# Patient Record
Sex: Female | Born: 1995 | Hispanic: Yes | Marital: Single | Smoking: Never smoker
Health system: Southern US, Community
[De-identification: ages and names within clinical notes are randomized; demographics above are authoritative.]

## PROBLEM LIST (undated history)

## (undated) HISTORY — PX: LAPAROSCOPIC GASTRIC SLEEVE RESECTION: SHX5895

---
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
## 2021-11-11 ENCOUNTER — Ambulatory Visit: Payer: Medicaid Other | Admitting: Internal Medicine

## 2021-11-11 NOTE — Progress Notes (Signed)
Pt did not show for scheduled appointment.  

## 2021-12-16 ENCOUNTER — Ambulatory Visit (INDEPENDENT_AMBULATORY_CARE_PROVIDER_SITE_OTHER): Payer: Medicaid Other | Admitting: Internal Medicine

## 2021-12-16 VITALS — BP 102/67 | HR 70 | Resp 14 | Ht 63.0 in | Wt 176.0 lb

## 2021-12-16 DIAGNOSIS — Z7189 Other specified counseling: Secondary | ICD-10-CM

## 2021-12-16 DIAGNOSIS — E669 Obesity, unspecified: Secondary | ICD-10-CM

## 2021-12-16 DIAGNOSIS — Z9989 Dependence on other enabling machines and devices: Secondary | ICD-10-CM | POA: Diagnosis not present

## 2021-12-16 DIAGNOSIS — G4733 Obstructive sleep apnea (adult) (pediatric): Secondary | ICD-10-CM | POA: Diagnosis not present

## 2021-12-16 NOTE — Progress Notes (Addendum)
[facility] [address] [place], [place] [address]  Pulmonary Sleep Medicine   Office Visit Note  Patient Name: Kelsey Wright DOB: [birth year] MRN [id number]    Chief Complaint: Obstructive Sleep Apnea visit  Brief History:  Kelsey Wright is seen today for initial consult to establish care and insurance compliance visit. The patient has a 7 month history of sleep apnea stating she had EDS, morning headaches, with mood changes and difficulty focusing. Patient compliantly using PAP nightly on small N30 nasal mask, until her machine got misplaced during her move..  The patient feels better after sleeping with PAP. Reported sleepiness is  resolved when using therapy and the Epworth Sleepiness Score is 5 out of 24. The patient does not take naps. The patient complains of the following: no problems  The compliance download shows  compliance with an average use time of 4:47 hours@ 93%. The AHI is 1.0  The patient does not complain of limb movements disrupting sleep, she did have this but it has resolved since starting cpap.   ROS  General: (-) fever, (-) chills, (-) night sweat Nose and Sinuses: (-) nasal stuffiness or itchiness, (-) postnasal drip, (-) nosebleeds, (-) sinus trouble. Mouth and Throat: (-) sore throat, (-) hoarseness. Neck: (-) swollen glands, (-) enlarged thyroid, (-) neck pain. Respiratory: - cough, - shortness of breath, - wheezing. Neurologic: - numbness, - tingling. Psychiatric: - anxiety, - depression   Current Medication: Outpatient Encounter Medications as of 12/16/2021  Medication Sig   omeprazole (PRILOSEC) 20 MG capsule Take 40 mg by mouth daily.   ursodiol (ACTIGALL) 300 MG capsule Take 300 mg by mouth 2 (two) times daily.   No facility-administered encounter medications on file as of 12/16/2021.    Surgical History: Past Surgical History:  Procedure Laterality Date   LAPAROSCOPIC GASTRIC SLEEVE RESECTION      Medical History: History  reviewed. No pertinent past medical history.  Family History: Non contributory to the present illness  Social History: Social History   Socioeconomic History   Marital status: Single    Spouse name: Not on file   Number of children: Not on file   Years of education: Not on file   Highest education level: Not on file  Occupational History   Not on file  Tobacco Use   Smoking status: Never   Smokeless tobacco: Never  Substance and Sexual Activity   Alcohol use: Not on file   Drug use: Not on file   Sexual activity: Not on file  Other Topics Concern   Not on file  Social History Narrative   Not on file   Social Determinants of Health   Financial Resource Strain: Not on file  Food Insecurity: Not on file  Transportation Needs: Not on file  Physical Activity: Not on file  Stress: Not on file  Social Connections: Not on file  Intimate Partner Violence: Not on file    Vital Signs: Blood pressure 102/67, pulse 70, resp. rate 14, height 5\' 3"  (1.6 m), weight 176 lb (79.8 kg), SpO2 98 %. Body mass index is 31.18 kg/m.    Examination: General Appearance: The patient is well-developed, well-nourished, and in no distress. Neck Circumference: 35.5 cm Skin: Gross inspection of skin unremarkable. Head: normocephalic, no gross deformities. Eyes: no gross deformities noted. ENT: ears appear grossly normal Neurologic: Alert and oriented. No involuntary movements.    EPWORTH SLEEPINESS SCALE:  Scale:  (0)= no chance of dozing; (1)= slight chance of dozing; (2)= moderate  chance of dozing; (3)= high chance of dozing  Chance  Situtation    Sitting and reading: 0    Watching TV: 1    Sitting Inactive in public: 1    As a passenger in car: 2      Lying down to rest: 1    Sitting and talking: 0    Sitting quielty after lunch: 0    In a car, stopped in traffic: 0   TOTAL SCORE:   5 out of 24    SLEEP STUDIES:  PSG 06/04/21  - overall AHI 11.8,  REM AHI 36.7,   Min SpO2 77%   CPAP COMPLIANCE DATA:  Date Range: 09/18/21 - 10/17/21  Average Daily Use: 4:47 hours  Median Use: 4:45 hours   Compliance for > 4 Hours: 93%  AHI: 1.0 respiratory events per hour  Days Used: 29/30  Mask Leak: 14.4 lpm  95th Percentile Pressure: 7 cmH2O     LABS: No results found for this or any previous visit (from the past 2160 hour(s)).  Radiology: No results found.  No results found.  No results found.    Assessment and Plan: Patient Active Problem List   Diagnosis Date Noted   OSA on CPAP 12/16/2021   CPAP use counseling 12/16/2021   Obesity (BMI 30-39.9) 12/16/2021   1. OSA on CPAP The patient does tolerate PAP and reports  benefit from PAP use. The patient was reminded how to clean equipment and advised to replace supplies routinely. The patient was also counselled on weight loss. The compliance is very good as of March 2023 at 93% with AHI of 1.0.    OSA- resume use of cpap. F/u 38m. CPAP is medically necessary to treat this patient's OSA.   2. CPAP use counseling CPAP Counseling: had a lengthy discussion with the patient regarding the importance of PAP therapy in management of the sleep apnea. Patient appears to understand the risk factor reduction and also understands the risks associated with untreated sleep apnea. Patient will try to make a good faith effort to remain compliant with therapy. Also instructed the patient on proper cleaning of the device including the water must be changed daily if possible and use of distilled water is preferred. Patient understands that the machine should be regularly cleaned with appropriate recommended cleaning solutions that do not damage the PAP machine for example given white vinegar and water rinses. Other methods such as ozone treatment may not be as good as these simple methods to achieve cleaning.   3. Obesity (BMI 30-39.9) Obesity Counseling: Had a lengthy discussion regarding patients BMI and  weight issues. Patient was instructed on portion control as well as increased activity. Also discussed caloric restrictions with trying to maintain intake less than 2000 Kcal. Discussions were made in accordance with the 5As of weight management. Simple actions such as not eating late and if able to, taking a walk is suggested.      General Counseling: I have discussed the findings of the evaluation and examination with Kelsey Wright.  I have also discussed any further diagnostic evaluation thatmay be needed or ordered today. Kelsey Wright verbalizes understanding of the findings of todays visit. We also reviewed her medications today and discussed drug interactions and side effects including but not limited excessive drowsiness and altered mental states. We also discussed that there is always a risk not just to her but also people around her. she has been encouraged to call the office with any questions or concerns that  should arise related to todays visit.  No orders of the defined types were placed in this encounter.       I have personally obtained a history, examined the patient, evaluated laboratory and imaging results, formulated the assessment and plan and placed orders. This patient was seen today by Tressie Ellis, PA-C in collaboration with Dr. Devona Konig.   Allyne Gee, MD [facility] Diplomate ABMS Pulmonary Critical Care Medicine and Sleep Medicine

## 2021-12-16 NOTE — Patient Instructions (Signed)

## 2022-11-17 ENCOUNTER — Ambulatory Visit: Payer: Medicaid Other | Admitting: Internal Medicine

## 2022-11-17 NOTE — Progress Notes (Unsigned)
Pt did not show for scheduled appointment.
